# Patient Record
Sex: Male | Born: 1975 | Race: Black or African American | Hispanic: No | Marital: Single | Smoking: Never smoker
Health system: Southern US, Community
[De-identification: ages and names within clinical notes are randomized; demographics above are authoritative.]

---

## 2004-08-08 ENCOUNTER — Emergency Department (HOSPITAL_COMMUNITY): Admission: EM | Admit: 2004-08-08 | Discharge: 2004-08-08 | Payer: Self-pay | Admitting: Emergency Medicine

## 2004-10-30 ENCOUNTER — Emergency Department (HOSPITAL_COMMUNITY): Admission: EM | Admit: 2004-10-30 | Discharge: 2004-10-30 | Payer: Self-pay | Admitting: *Deleted

## 2014-08-22 ENCOUNTER — Emergency Department (HOSPITAL_COMMUNITY)
Admission: EM | Admit: 2014-08-22 | Discharge: 2014-08-22 | Disposition: A | Discharge status: Home / Self Care | Payer: Self-pay | Attending: Emergency Medicine | Admitting: Emergency Medicine

## 2014-08-22 ENCOUNTER — Encounter (HOSPITAL_COMMUNITY): Payer: Self-pay | Admitting: Emergency Medicine

## 2014-08-22 DIAGNOSIS — J029 Acute pharyngitis, unspecified: Secondary | ICD-10-CM | POA: Insufficient documentation

## 2014-08-22 LAB — RAPID STREP SCREEN (MED CTR MEBANE ONLY): STREPTOCOCCUS, GROUP A SCREEN (DIRECT): NEGATIVE

## 2014-08-22 MED ORDER — PENICILLIN G BENZATHINE 1200000 UNIT/2ML IM SUSP
1.2000 10*6.[IU] | Freq: Once | INTRAMUSCULAR | Status: AC
  Administered 2014-08-22: 1.2 10*6.[IU] via INTRAMUSCULAR
  Filled 2014-08-22: qty 2

## 2014-08-22 MED ORDER — HYDROCODONE-ACETAMINOPHEN 7.5-325 MG/15ML PO SOLN: 15.0000 mL | ORAL | Status: AC | PRN

## 2014-08-22 MED ORDER — METHYLPREDNISOLONE SODIUM SUCC 125 MG IJ SOLR
125.0000 mg | Freq: Once | INTRAMUSCULAR | Status: AC
  Administered 2014-08-22: 125 mg via INTRAMUSCULAR
  Filled 2014-08-22: qty 2

## 2014-08-22 NOTE — Discharge Instructions (Signed)
Take the prescribed medication as directed. °Follow-up with your primary care physician. °Return to the ED for new or worsening symptoms. ° °

## 2014-08-22 NOTE — ED Notes (Signed)
Started 2 days ago with sore throat. Has had body aches and chills. Tonsils swollen with exudate.

## 2014-08-22 NOTE — ED Provider Notes (Signed)
CSN: [id number]     Arrival date & time 08/22/14  1119 History  This chart was scribed for non-physician practitioner, Sharilyn SitesLisa Albion Weatherholtz, PA-C,working with Warnell Foresterrey Wofford, MD, by Karle PlumberJennifer Tensley, ED Scribe. This patient was seen in room TR05C/TR05C and the patient's care was started at 12:13 PM.  Chief Complaint  Patient presents with  . Sore Throat   Patient is a 38 y.o. male presenting with pharyngitis. The history is provided by the patient. No language interpreter was used.  Sore Throat    HPI Comments:  Jeff Blackwell is a 38 y.o. male who presents to the Emergency Department complaining of a severe sore throat that began four days ago. Pt reports generalized body aches and some swelling of his tonsils. Pt reports swallowing makes the pain worse. He denies any alleviating factors. He denies fever, chills, inability to swallow, nausea or vomiting.  History reviewed. No pertinent past medical history. History reviewed. No pertinent past surgical history. No family history on file. History  Substance Use Topics  . Smoking status: Never Smoker   . Smokeless tobacco: Not on file  . Alcohol Use: No    Review of Systems  Constitutional: Negative for fever and chills.  HENT: Positive for sore throat. Negative for trouble swallowing.   Gastrointestinal: Negative for nausea and vomiting.  Musculoskeletal: Positive for myalgias.  All other systems reviewed and are negative.   Allergies  Review of patient's allergies indicates no known allergies.  Home Medications   Prior to Admission medications   Not on File   Triage Vitals: BP 120/57 mmHg  Pulse 83  Temp(Src) 98.1 F (36.7 C) (Oral)  Resp 22  SpO2 100% Physical Exam  Constitutional: He is oriented to person, place, and time. He appears well-developed and well-nourished.  HENT:  Head: Normocephalic and atraumatic.  Mouth/Throat: Uvula is midline, oropharynx is clear and moist and mucous membranes are normal. No trismus in the  jaw. No oropharyngeal exudate, posterior oropharyngeal edema, posterior oropharyngeal erythema or tonsillar abscesses.  Tonsils 2+ bilaterally with exudates present; uvula midline without peritonsillar abscess; handling secretions appropriately; no difficulty swallowing or speaking  Eyes: Conjunctivae and EOM are normal. Pupils are equal, round, and reactive to light.  Neck: Normal range of motion.  Cardiovascular: Normal rate, regular rhythm and normal heart sounds.   Pulmonary/Chest: Effort normal and breath sounds normal.  Abdominal: Soft. Bowel sounds are normal.  Musculoskeletal: Normal range of motion.  Lymphadenopathy:    He has cervical adenopathy.  Neurological: He is alert and oriented to person, place, and time.  Skin: Skin is warm and dry.  Psychiatric: He has a normal mood and affect.  Nursing note and vitals reviewed.   ED Course  Procedures (including critical care time) DIAGNOSTIC STUDIES: Oxygen Saturation is 100% on RA, normal by my interpretation.   COORDINATION OF CARE: 12:49 PM- Will order Pen-VK injection, pain medication and steroid injection. Pt verbalizes understanding and agrees to plan.  Medications  methylPREDNISolone sodium succinate (SOLU-MEDROL) 125 mg/2 mL injection 125 mg (125 mg Intramuscular Given 08/22/14 1222)  penicillin g benzathine (BICILLIN LA) 1200000 UNIT/2ML injection 1.2 Million Units (1.2 Million Units Intramuscular Given 08/22/14 1223)    Labs Review Labs Reviewed  RAPID STREP SCREEN  CULTURE, GROUP A STREP    Imaging Review No results found.   EKG Interpretation None      MDM   Final diagnoses:  Sore throat   Rapid strep negative, culture pending.  Signs/sx concerning for strep pharyngitis.  Uvula remains midline without concern for PTA, handling secretions well.  Patient treated with bicillin and solu-medrol in the ED.  Hycet for home.  Discussed plan with patient, he/she acknowledged understanding and agreed with plan of  care.  Return precautions given for new or worsening symptoms.  I personally performed the services described in this documentation, which was scribed in my presence. The recorded information has been reviewed and is accurate.  Garlon HatchetLisa M Annalucia Laino, PA-C 08/22/14 1349  Warnell Foresterrey Wofford, MD 08/22/14 [phone]

## 2014-08-25 LAB — CULTURE, GROUP A STREP

## 2014-08-26 ENCOUNTER — Telehealth (HOSPITAL_BASED_OUTPATIENT_CLINIC_OR_DEPARTMENT_OTHER): Payer: Self-pay | Admitting: Emergency Medicine

## 2014-08-26 NOTE — Telephone Encounter (Signed)
Post ED Visit - Positive Culture Follow-up  Culture report reviewed by antimicrobial stewardship pharmacist: []  Wes Dulaney, Pharm.D., BCPS []  Celedonio MiyamotoJeremy Frens, Pharm.D., BCPS []  Georgina PillionElizabeth Martin, Pharm.D., BCPS []  Hasley CanyonMinh Pham, [address] BoulevardPharm.D., BCPS, AAHIVP []  Estella HuskMichelle Turner, Pharm.D., BCPS, AAHIVP [x]  Babs BertinHaley Baird, Pharm.D.   Positive strep culture Treated with biciliin in ED,  no further patient follow-up is required at this time.  Berle MullMiller, Lonisha Bobby 08/26/2014, 12:39 PM

## 2018-12-20 ENCOUNTER — Emergency Department (HOSPITAL_COMMUNITY)
Admission: EM | Admit: 2018-12-20 | Discharge: 2018-12-20 | Disposition: A | Discharge status: Home / Self Care | Payer: Self-pay | Attending: Emergency Medicine | Admitting: Emergency Medicine

## 2018-12-20 ENCOUNTER — Encounter (HOSPITAL_COMMUNITY): Payer: Self-pay

## 2018-12-20 ENCOUNTER — Emergency Department (HOSPITAL_COMMUNITY): Payer: Self-pay

## 2018-12-20 ENCOUNTER — Other Ambulatory Visit: Payer: Self-pay

## 2018-12-20 DIAGNOSIS — F1210 Cannabis abuse, uncomplicated: Secondary | ICD-10-CM | POA: Insufficient documentation

## 2018-12-20 DIAGNOSIS — R42 Dizziness and giddiness: Secondary | ICD-10-CM | POA: Insufficient documentation

## 2018-12-20 DIAGNOSIS — R55 Syncope and collapse: Secondary | ICD-10-CM | POA: Insufficient documentation

## 2018-12-20 LAB — BASIC METABOLIC PANEL WITH GFR
Anion gap: 7 (ref 5–15)
BUN: 13 mg/dL (ref 6–20)
CO2: 27 mmol/L (ref 22–32)
Calcium: 9.1 mg/dL (ref 8.9–10.3)
Chloride: 104 mmol/L (ref 98–111)
Creatinine, Ser: 1.22 mg/dL (ref 0.61–1.24)
GFR calc Af Amer: >60 mL/min
GFR calc non Af Amer: >60 mL/min
Glucose, Bld: 95 mg/dL (ref 70–99)
Potassium: 4.7 mmol/L (ref 3.5–5.1)
Sodium: 138 mmol/L (ref 135–145)

## 2018-12-20 LAB — CBC WITH DIFFERENTIAL/PLATELET
ABS IMMATURE GRANULOCYTES: 0 10*3/uL (ref 0.00–0.07)
BASOS ABS: 0.1 10*3/uL (ref 0.0–0.1)
BASOS PCT: 1 %
EOS ABS: 0.1 10*3/uL (ref 0.0–0.5)
Eosinophils Relative: 2 %
HCT: 44 % (ref 39.0–52.0)
Hemoglobin: 14.2 g/dL (ref 13.0–17.0)
IMMATURE GRANULOCYTES: 0 %
LYMPHS ABS: 1.6 10*3/uL (ref 0.7–4.0)
Lymphocytes Relative: 41 %
MCH: 30.5 pg (ref 26.0–34.0)
MCHC: 32.3 g/dL (ref 30.0–36.0)
MCV: 94.4 fL (ref 80.0–100.0)
MONOS PCT: 9 %
Monocytes Absolute: 0.3 10*3/uL (ref 0.1–1.0)
NEUTROS PCT: 47 %
NRBC: 0 % (ref 0.0–0.2)
Neutro Abs: 1.8 10*3/uL (ref 1.7–7.7)
PLATELETS: 188 10*3/uL (ref 150–400)
RBC: 4.66 MIL/uL (ref 4.22–5.81)
RDW: 12 % (ref 11.5–15.5)
WBC: 3.9 10*3/uL — ABNORMAL LOW (ref 4.0–10.5)

## 2018-12-20 MED ORDER — SODIUM CHLORIDE 0.9 % IV BOLUS
1000.0000 mL | Freq: Once | INTRAVENOUS | Status: AC
  Administered 2018-12-20: 1000 mL via INTRAVENOUS

## 2018-12-20 NOTE — Discharge Instructions (Addendum)
You were seen in the hospital for a fainting episode that occurred yesterday.  You had blood work EKG and chest x-ray that did not show any obvious abnormalities.  It will be important for you to stay well-hydrated and also to work on getting a primary care doctor for further evaluation.  Please return if any worsening symptoms.

## 2018-12-20 NOTE — ED Notes (Signed)
Patient verbalizes understanding of discharge instructions. Opportunity for questioning and answers were provided. Armband removed by staff, pt discharged from ED.  

## 2018-12-20 NOTE — ED Notes (Signed)
Pt returned from xray

## 2018-12-20 NOTE — ED Triage Notes (Signed)
Pt nearly "passed out" yesterday, sitting on porch smoking weed. When he stood up he got light headed. No more issues.

## 2018-12-20 NOTE — ED Provider Notes (Signed)
[facility] EMERGENCY DEPARTMENT Provider Note   CSN: [id number] Arrival date & time: 12/20/18  1125    History   Chief Complaint Chief Complaint  Patient presents with  . Near Syncope    HPI Jeff Blackwell is a 43 y.o. male.  He has no significant past medical history.  He said he was sitting on the steps of a friend's porch when he saw a bright light and started feeling lightheaded.  He tried to get up to walk into the house and lay down on the couch when he fainted.  Sounds like he struck his chest going down to the ground.  He was out briefly and then felt better after resting a while.  He was able to go to work later but wanted to come here this morning to get checked out make sure he was okay.  No prior history of syncope.  York Spaniel he is been under a lot of stress with recent deaths in the family.  He was smoking some marijuana on the porch when this happened which is not unusual for him.     The history is provided by the patient.  Loss of Consciousness  Episode history:  Single Most recent episode:  Yesterday Progression:  Resolved Chronicity:  New Context: standing up   Witnessed: yes   Relieved by:  Bed rest Worsened by:  Nothing Ineffective treatments:  None tried Associated symptoms: no chest pain, no difficulty breathing, no fever, no focal weakness, no headaches, no nausea, no recent surgery, no shortness of breath and no vomiting   Risk factors: no coronary artery disease and no seizures     No past medical history on file.  There are no active problems to display for this patient.   No past surgical history on file.      Home Medications    Prior to Admission medications   Medication Sig Start Date End Date Taking? Authorizing Provider  HYDROcodone-acetaminophen (HYCET) 7.5-325 mg/15 ml solution Take 15 mLs by mouth every 4 (four) hours as needed for moderate pain or severe pain. 08/22/14   Garlon Hatchet, PA-C    Family History No  family history on file.  Social History Social History   Tobacco Use  . Smoking status: Never Smoker  Substance Use Topics  . Alcohol use: No  . Drug use: Yes    Types: Marijuana     Allergies   Patient has no known allergies.   Review of Systems Review of Systems  Constitutional: Negative for fever.  HENT: Negative for sore throat.   Eyes: Negative for visual disturbance.  Respiratory: Negative for shortness of breath.   Cardiovascular: Positive for syncope. Negative for chest pain.  Gastrointestinal: Negative for abdominal pain, nausea and vomiting.  Genitourinary: Negative for dysuria.  Musculoskeletal: Negative for neck pain.  Skin: Negative for rash.  Neurological: Positive for syncope. Negative for focal weakness and headaches.     Physical Exam Updated Vital Signs BP (!) 143/79 (BP Location: Right Arm)   Pulse 62   Temp 97.7 F (36.5 C) (Oral)   Resp 18   SpO2 100%   Physical Exam Vitals signs and nursing note reviewed.  Constitutional:      Appearance: He is well-developed.  HENT:     Head: Normocephalic and atraumatic.  Eyes:     Conjunctiva/sclera: Conjunctivae normal.  Neck:     Musculoskeletal: Neck supple.  Cardiovascular:     Rate and Rhythm: Normal rate and  regular rhythm.     Heart sounds: No murmur.  Pulmonary:     Effort: Pulmonary effort is normal. No respiratory distress.     Breath sounds: Normal breath sounds.  Abdominal:     Palpations: Abdomen is soft.     Tenderness: There is no abdominal tenderness.  Musculoskeletal: Normal range of motion.     Right lower leg: No edema.     Left lower leg: No edema.  Skin:    General: Skin is warm and dry.     Capillary Refill: Capillary refill takes less than 2 seconds.  Neurological:     General: No focal deficit present.     Mental Status: He is alert and oriented to person, place, and time.     Sensory: No sensory deficit.     Motor: No weakness.     Gait: Gait normal.      ED  Treatments / Results  Labs (all labs ordered are listed, but only abnormal results are displayed) Labs Reviewed  CBC WITH DIFFERENTIAL/PLATELET - Abnormal; Notable for the following components:      Result Value   WBC 3.9 (*)    All other components within normal limits  BASIC METABOLIC PANEL    EKG EKG Interpretation  Date/Time:  Thursday December 20 2018 11:43:29 EST Ventricular Rate:  52 PR Interval:    QRS Duration: 99 QT Interval:  418 QTC Calculation: 389 R Axis:   83 Text Interpretation:  Sinus rhythm Probable left atrial enlargement no prior to compare with Confirmed by Meridee Score [phone]) on 12/20/2018 11:48:15 AM   Radiology Dg Chest 2 View  Result Date: 12/20/2018 CLINICAL DATA:  Syncopal episode today. EXAM: CHEST - 2 VIEW COMPARISON:  None. FINDINGS: The heart size and mediastinal contours are within normal limits. Both lungs are clear. The visualized skeletal structures are unremarkable. IMPRESSION: Normal chest x-ray. Electronically Signed   By: Rudie Meyer M.D.   On: 12/20/2018 12:45    Procedures Procedures (including critical care time)  Medications Ordered in ED Medications  sodium chloride 0.9 % bolus 1,000 mL (has no administration in time range)     Initial Impression / Assessment and Plan / ED Course  I have reviewed the triage vital signs and the nursing notes.  Pertinent labs & imaging results that were available during my care of the patient were reviewed by me and considered in my medical decision making (see chart for details).  Clinical Course as of Dec 19 1524  Thu Dec 20, 2018  1354 Patient with a syncopal event yesterday.  He feels fine today but wants to get checked out.  His exam is normal.  He is slightly bradycardic here but normal or even elevated blood pressure.  Blood work benign.  He is comfortable going home and he needs to set himself up with a primary care doctor.  He understands to return if any worsening symptoms.   [MB]      Clinical Course User Index [MB] Terrilee Files, MD         Final Clinical Impressions(s) / ED Diagnoses   Final diagnoses:  Syncope and collapse    ED Discharge Orders    None       Terrilee Files, MD 12/20/18 1527

## 2019-12-05 IMAGING — DX DG CHEST 2V
2 series · 2 of 2 positions shown · non-contrast
Comparison: None.

CLINICAL DATA: Syncopal episode today.

EXAM:
CHEST - 2 VIEW

[w chest pa]
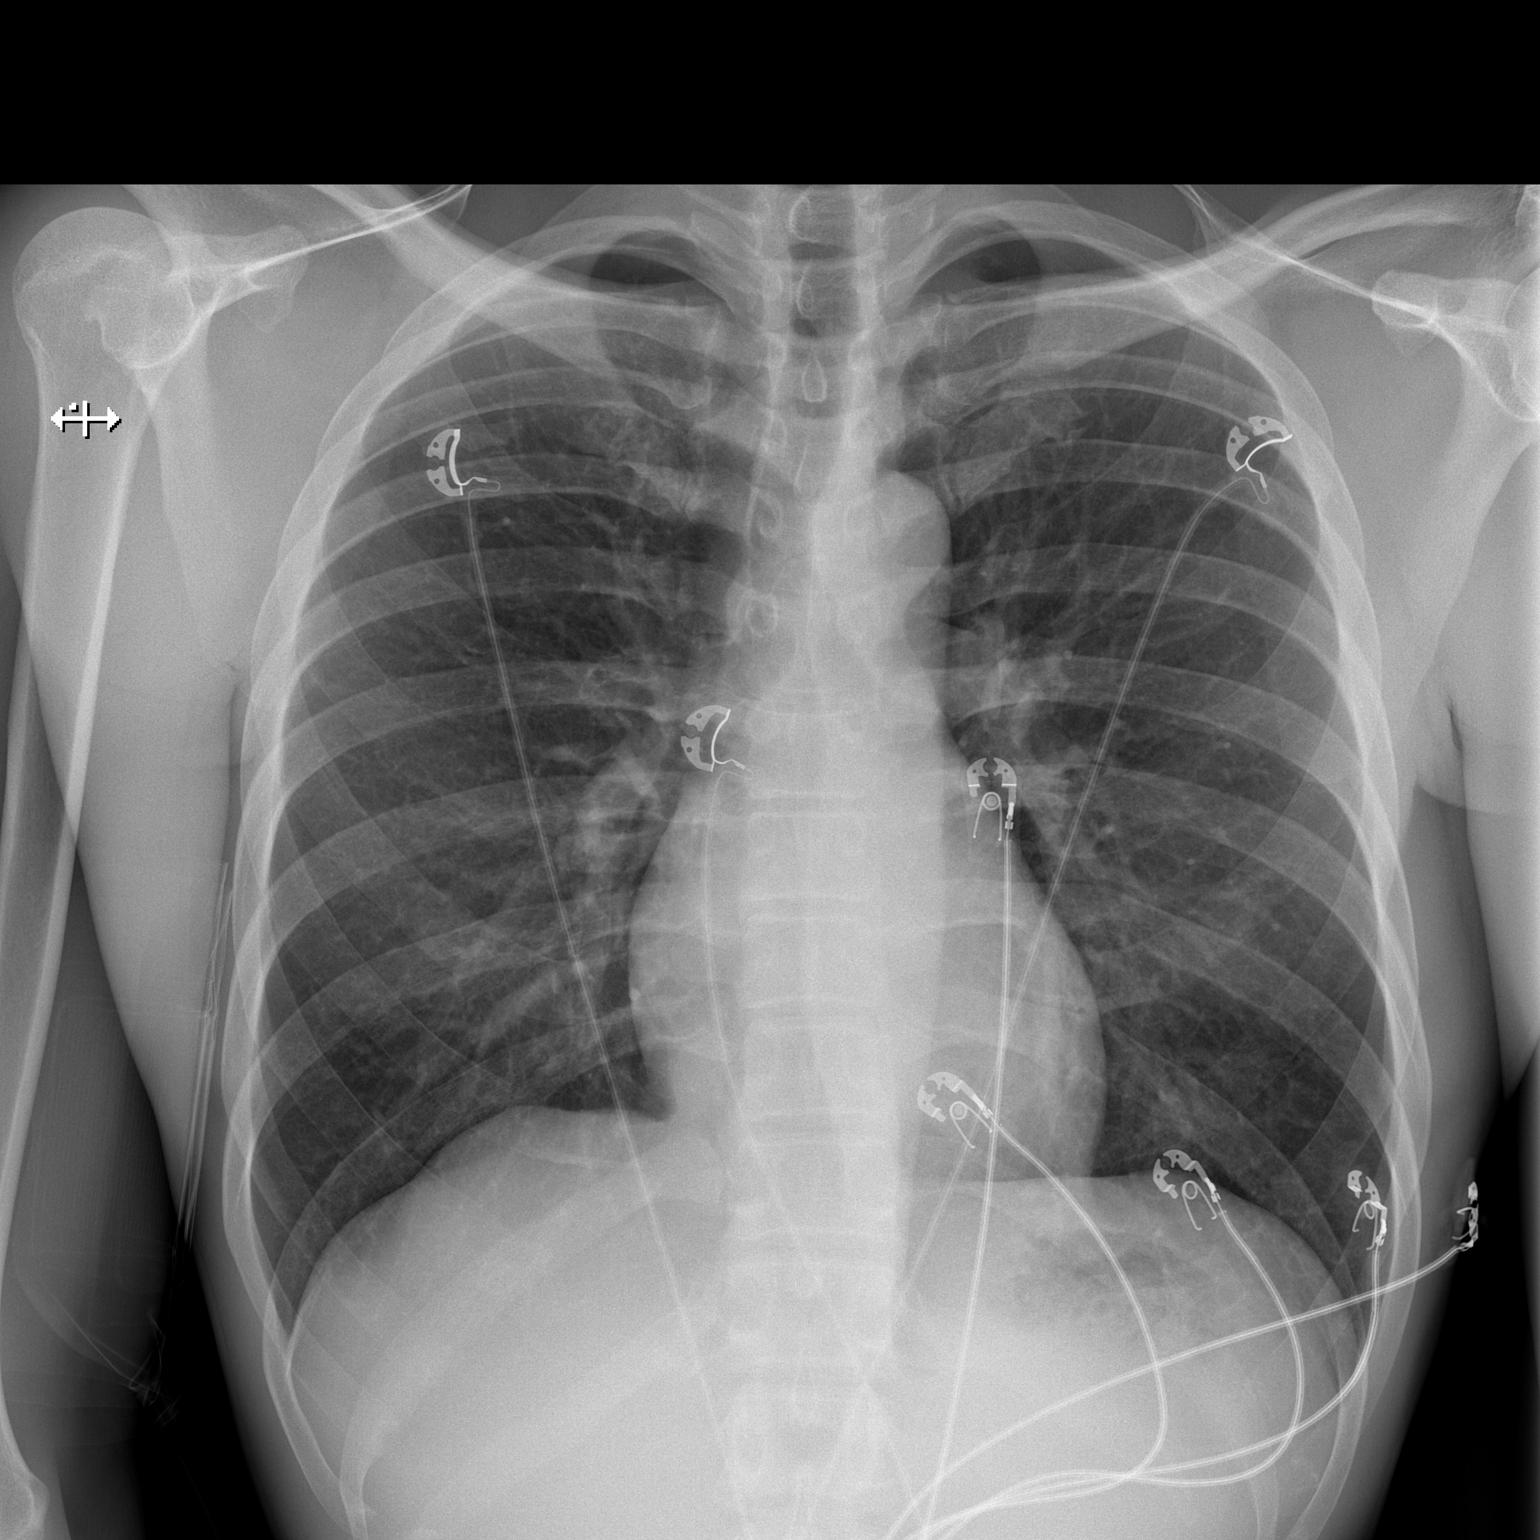

[w chest lat]
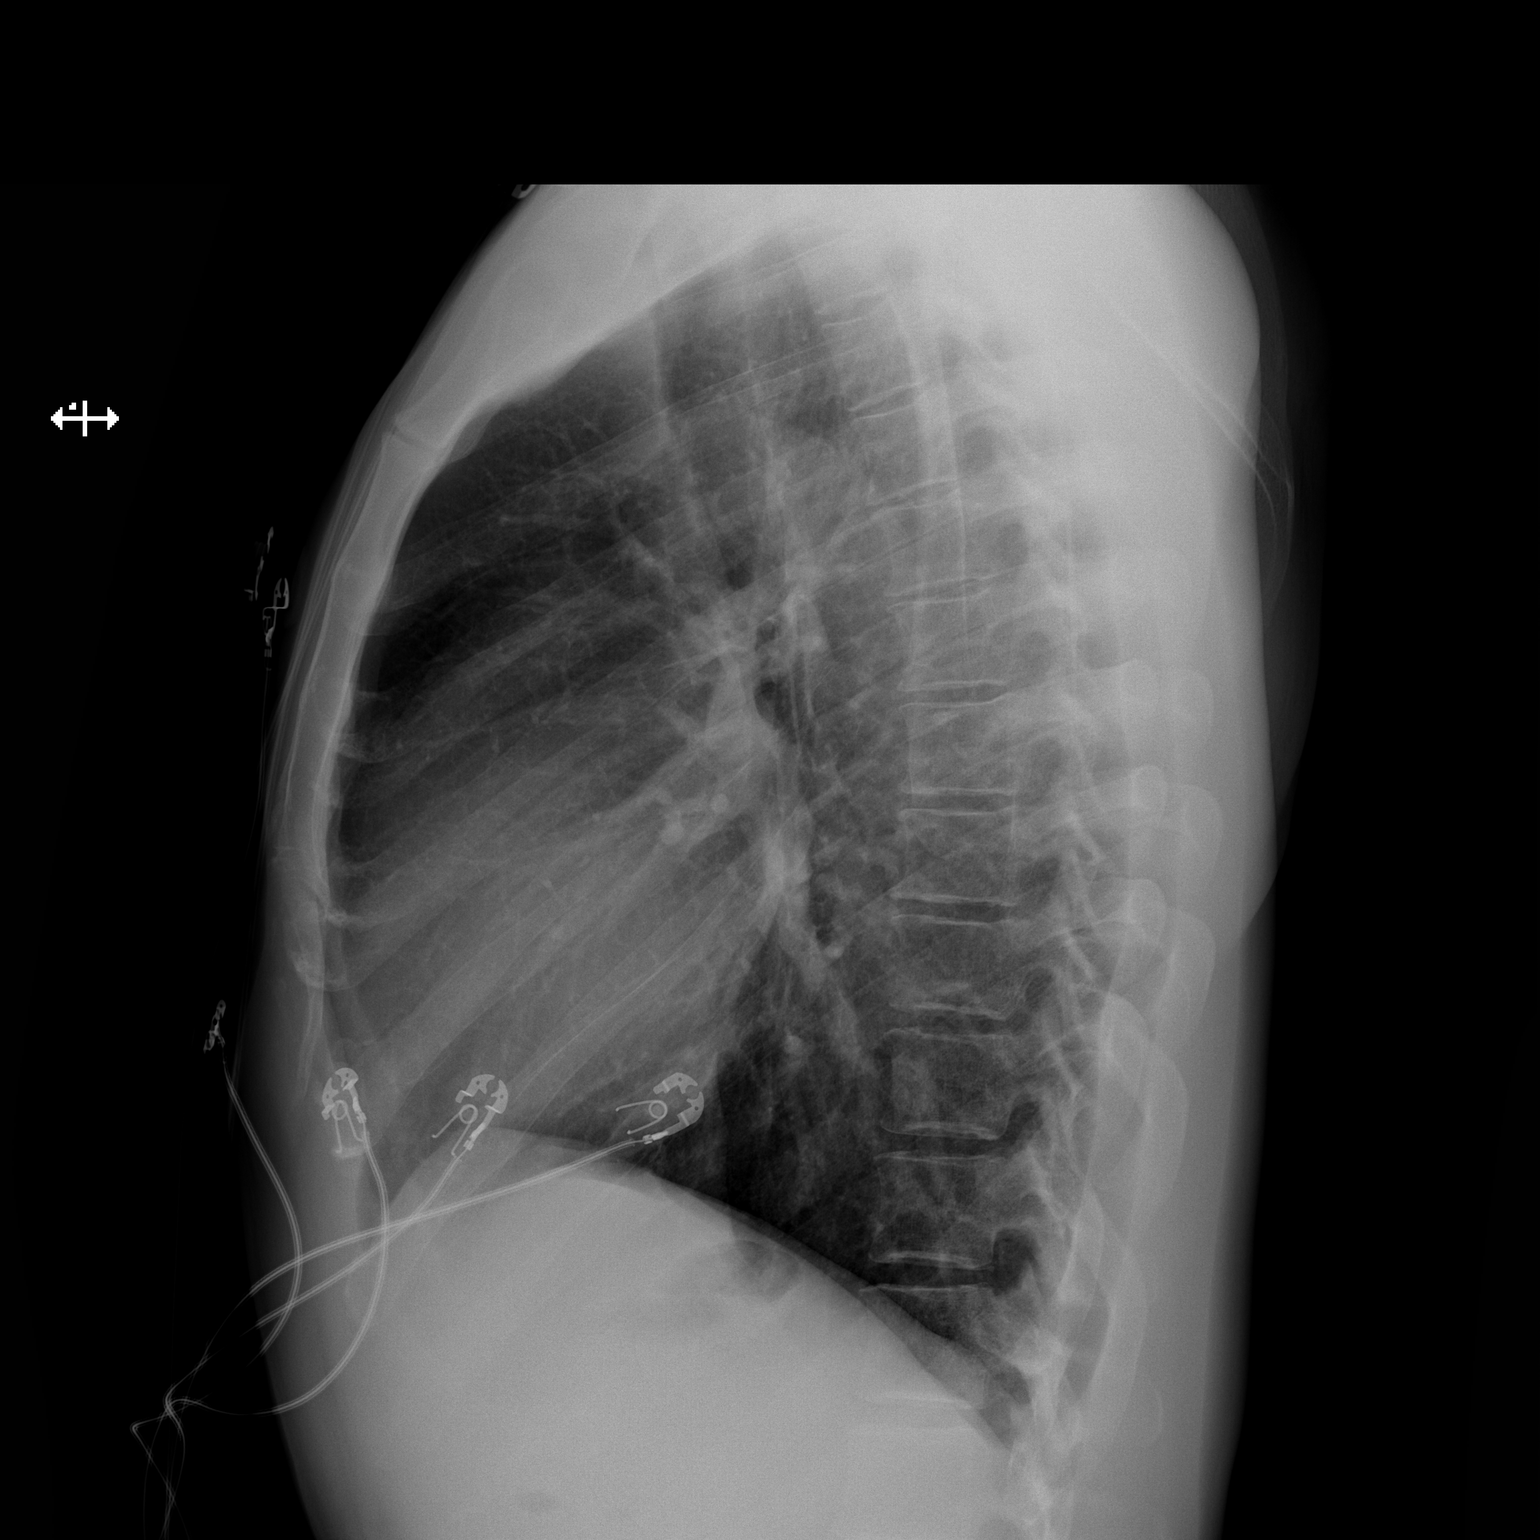

[2 of 2 positions shown; findings below may reference images not displayed]

FINDINGS: The heart size and mediastinal contours are within normal limits.
Both lungs are clear. The visualized skeletal structures are
unremarkable.
IMPRESSION: Normal chest x-ray.
